# Patient Record
Sex: Male | Born: 1990 | Race: White | Hispanic: No | Marital: Single | Smoking: Never smoker
Health system: Southern US, Community
[De-identification: ages and names within clinical notes are randomized; demographics above are authoritative.]

## PROBLEM LIST (undated history)

## (undated) DIAGNOSIS — F419 Anxiety disorder, unspecified: Secondary | ICD-10-CM

## (undated) DIAGNOSIS — F32A Depression, unspecified: Secondary | ICD-10-CM

## (undated) DIAGNOSIS — F319 Bipolar disorder, unspecified: Secondary | ICD-10-CM

## (undated) DIAGNOSIS — F909 Attention-deficit hyperactivity disorder, unspecified type: Secondary | ICD-10-CM

## (undated) HISTORY — PX: ANKLE SURGERY: SHX546

## (undated) HISTORY — DX: Depression, unspecified: F32.A

## (undated) HISTORY — DX: Anxiety disorder, unspecified: F41.9

---

## 2017-09-30 ENCOUNTER — Emergency Department (HOSPITAL_COMMUNITY)
Admission: EM | Admit: 2017-09-30 | Discharge: 2017-10-01 | Disposition: A | Discharge status: Home / Self Care | Payer: Self-pay | Attending: Emergency Medicine | Admitting: Emergency Medicine

## 2017-09-30 ENCOUNTER — Encounter (HOSPITAL_COMMUNITY): Payer: Self-pay

## 2017-09-30 DIAGNOSIS — F3131 Bipolar disorder, current episode depressed, mild: Secondary | ICD-10-CM | POA: Insufficient documentation

## 2017-09-30 DIAGNOSIS — F902 Attention-deficit hyperactivity disorder, combined type: Secondary | ICD-10-CM | POA: Insufficient documentation

## 2017-09-30 DIAGNOSIS — F319 Bipolar disorder, unspecified: Secondary | ICD-10-CM

## 2017-09-30 DIAGNOSIS — R45851 Suicidal ideations: Secondary | ICD-10-CM | POA: Insufficient documentation

## 2017-09-30 HISTORY — DX: Bipolar disorder, unspecified: F31.9

## 2017-09-30 HISTORY — DX: Attention-deficit hyperactivity disorder, unspecified type: F90.9

## 2017-09-30 LAB — COMPREHENSIVE METABOLIC PANEL
ALBUMIN: 3.8 g/dL (ref 3.5–5.0)
ALK PHOS: 50 U/L (ref 38–126)
ALT: 19 U/L (ref 17–63)
ANION GAP: 11 (ref 5–15)
AST: 33 U/L (ref 15–41)
BUN: 15 mg/dL (ref 6–20)
CALCIUM: 9.4 mg/dL (ref 8.9–10.3)
CHLORIDE: 105 mmol/L (ref 101–111)
CO2: 23 mmol/L (ref 22–32)
Creatinine, Ser: 0.95 mg/dL (ref 0.61–1.24)
GFR calc Af Amer: >60 mL/min (ref >60)
GFR calc non Af Amer: >60 mL/min (ref >60)
GLUCOSE: 115 mg/dL — AB (ref 65–99)
Potassium: 3.9 mmol/L (ref 3.5–5.1)
Sodium: 139 mmol/L (ref 135–145)
Total Bilirubin: 0.5 mg/dL (ref 0.3–1.2)
Total Protein: 6.4 g/dL — ABNORMAL LOW (ref 6.5–8.1)

## 2017-09-30 LAB — RAPID URINE DRUG SCREEN, HOSP PERFORMED
AMPHETAMINES: NOT DETECTED
BARBITURATES: NOT DETECTED
BENZODIAZEPINES: NOT DETECTED
COCAINE: NOT DETECTED
OPIATES: NOT DETECTED
TETRAHYDROCANNABINOL: NOT DETECTED

## 2017-09-30 LAB — ETHANOL: Alcohol, Ethyl (B): <10 mg/dL (ref <10)

## 2017-09-30 LAB — SALICYLATE LEVEL

## 2017-09-30 LAB — CBC
HEMATOCRIT: 43.4 % (ref 39.0–52.0)
HEMOGLOBIN: 15 g/dL (ref 13.0–17.0)
MCH: 31.4 pg (ref 26.0–34.0)
MCHC: 34.6 g/dL (ref 30.0–36.0)
MCV: 90.8 fL (ref 78.0–100.0)
Platelets: 264 10*3/uL (ref 150–400)
RBC: 4.78 MIL/uL (ref 4.22–5.81)
RDW: 12.5 % (ref 11.5–15.5)
WBC: 9.1 10*3/uL (ref 4.0–10.5)

## 2017-09-30 LAB — ACETAMINOPHEN LEVEL

## 2017-09-30 MED ORDER — NICOTINE 21 MG/24HR TD PT24
21.0000 mg | MEDICATED_PATCH | Freq: Every day | TRANSDERMAL | Status: DC
  Administered 2017-09-30 – 2017-10-01 (×2): 21 mg via TRANSDERMAL
  Filled 2017-09-30 (×2): qty 1

## 2017-09-30 MED ORDER — ZOLPIDEM TARTRATE 5 MG PO TABS
5.0000 mg | ORAL_TABLET | Freq: Every evening | ORAL | Status: DC | PRN
  Administered 2017-09-30: 5 mg via ORAL
  Filled 2017-09-30: qty 1

## 2017-09-30 MED ORDER — LORAZEPAM 1 MG PO TABS
1.0000 mg | ORAL_TABLET | Freq: Four times a day (QID) | ORAL | Status: DC | PRN
  Administered 2017-09-30: 1 mg via ORAL
  Filled 2017-09-30: qty 1

## 2017-09-30 MED ORDER — ACETAMINOPHEN 325 MG PO TABS: 650.0000 mg | ORAL_TABLET | ORAL | Status: DC | PRN

## 2017-09-30 MED ORDER — ONDANSETRON HCL 4 MG PO TABS: 4.0000 mg | ORAL_TABLET | Freq: Three times a day (TID) | ORAL | Status: DC | PRN

## 2017-09-30 MED ORDER — ALUM & MAG HYDROXIDE-SIMETH 200-200-20 MG/5ML PO SUSP: 30.0000 mL | Freq: Four times a day (QID) | ORAL | Status: DC | PRN

## 2017-09-30 NOTE — ED Notes (Signed)
TTS performed 

## 2017-09-30 NOTE — ED Notes (Signed)
Pt requesting something for sleep

## 2017-09-30 NOTE — ED Notes (Signed)
Pt changed into burgundy scrubs and socks. Pt removed all of his personal clothing. Pt wanded by security. Belongings placed at nurses' desk for parents to take when they leave.

## 2017-09-30 NOTE — BH Assessment (Addendum)
Tele Assessment Note   Patient Name: Travis Glass MRN: [id number] Referring Physician: PA Cheron Schaumann Location of Patient: [facility] ED Location of Provider: Behavioral Health TTS Department  Travis Glass is an 27 y.o. male. The pt came in after getting in an argument with his parents and stating he wants to die.  The pt now says he was "just talking" and he does not want to die. He denies having a plan of how he would kill himself.  The pt reported there are guns in the home, but he does not have access to the guns. He currently lives with his parents.  TTS spoke with the pt while the pt's parents were in the room.  His parents agreed that the pt was probably taking out of anger and the pt does not have access to the guns in the home. The pt has one previous suicide attempt, when he was 16 and overdosed on medication. He was hospitalized at that time.  He currently denies any depressive symptoms.  He reports he is sleeping and eating OK.  He has a history of abusing heroin and xanax.  He has not used any substance in 9 months.  His UDS is negative for all substances.  The pt stated he wants to get back on his medication (Seroquel) and has been off of it for about a year.  He stated he has not found a provider, because he does not have any insurance.  He denies HI, SA and psychosis   Diagnosis: F31.31 Bipolar I disorder, Current or most recent episode depressed, Mild, by history  Past Medical History:  Past Medical History:  Diagnosis Date  . ADHD   . Bipolar 1 disorder (HCC)     History reviewed. No pertinent surgical history.  Family History: No family history on file.  Social History:  reports that  has never smoked. he has never used smokeless tobacco. He reports that he does not drink alcohol or use drugs.  Additional Social History:  Alcohol / Drug Use Pain Medications: See MAR Prescriptions: See MAR Over the Counter: See MAR History of alcohol / drug use?:  Yes Longest period of sobriety (when/how long): 9 months Negative Consequences of Use: Legal Substance #1 Name of Substance 1: heroin 1 - Age of First Use: 18 1 - Amount (size/oz):  half to a gram  1 - Frequency: daily 1 - Duration: 8 years 1 - Last Use / Amount: 01/2017 Substance #2 Name of Substance 2: Xanax 2 - Age of First Use: 18 2 - Amount (size/oz): 2 miligrams 2 - Frequency: 2 times a week 2 - Duration: 8 years 2 - Last Use / Amount: 01/2017  CIWA: CIWA-Ar BP: 136/78 Pulse Rate: 72 COWS:    Allergies: No Known Allergies  Home Medications:  (Not in a hospital admission)  OB/GYN Status:  No LMP for male patient.  General Assessment Data Location of Assessment: [facility] ED TTS Assessment: In system Is this a Tele or Face-to-Face Assessment?: Tele Assessment Is this an Initial Assessment or a Re-assessment for this encounter?: Initial Assessment Marital status: Single Maiden name: NA Is patient pregnant?: Other (Comment)(a male) Pregnancy Status: No Living Arrangements: Parent Can pt return to current living arrangement?: Yes Admission Status: Voluntary Is patient capable of signing voluntary admission?: Yes Referral Source: Self/Family/Friend Insurance type: none     Crisis Care Plan Living Arrangements: Parent Legal Guardian: Other:(Self) Name of Psychiatrist: none Name of Therapist: none  Education Status Is patient  currently in school?: No Current Grade: NA Highest grade of school patient has completed: high school diploma Name of school: NA Contact person: NA  Risk to self with the past 6 months Suicidal Ideation: No Has patient been a risk to self within the past 6 months prior to admission? : No Suicidal Intent: No Has patient had any suicidal intent within the past 6 months prior to admission? : No Is patient at risk for suicide?: No Suicidal Plan?: No Has patient had any suicidal plan within the past 6 months prior to admission? : No Access to  Means: No What has been your use of drugs/alcohol within the last 12 months?: past heroin and xanax use Previous Attempts/Gestures: Yes How many times?: 1 Other Self Harm Risks: none Triggers for Past Attempts: Unknown Intentional Self Injurious Behavior: None Family Suicide History: Yes(grand father committed suicide) Recent stressful life event(s): Conflict (Comment)(argument with parents) Persecutory voices/beliefs?: No Depression: No Substance abuse history and/or treatment for substance abuse?: Yes Suicide prevention information given to non-admitted patients: Yes  Risk to Others within the past 6 months Homicidal Ideation: No Does patient have any lifetime risk of violence toward others beyond the six months prior to admission? : No Thoughts of Harm to Others: No Current Homicidal Intent: No Current Homicidal Plan: No Access to Homicidal Means: No Identified Victim: NA History of harm to others?: No Assessment of Violence: None Noted Violent Behavior Description: none Does patient have access to weapons?: No(guns in home but locked up) Criminal Charges Pending?: No Does patient have a court date: No Is patient on probation?: No  Psychosis Hallucinations: None noted Delusions: None noted  Mental Status Report Appearance/Hygiene: Unremarkable Eye Contact: Good Motor Activity: Freedom of movement, Unremarkable Speech: Logical/coherent Level of Consciousness: Alert Mood: Pleasant Affect: Appropriate to circumstance Anxiety Level: None Thought Processes: Coherent, Relevant Judgement: Partial Orientation: Person, Place, Time, Situation, Appropriate for developmental age Obsessive Compulsive Thoughts/Behaviors: None  Cognitive Functioning Concentration: Normal Memory: Recent Intact, Remote Intact IQ: Average Insight: Fair Impulse Control: Fair Appetite: Good Weight Loss: 0 Weight Gain: 0 Sleep: No Change Total Hours of Sleep: 8 Vegetative Symptoms:  None  ADLScreening [facility] Assessment Services) Patient's cognitive ability adequate to safely complete daily activities?: Yes Patient able to express need for assistance with ADLs?: Yes Independently performs ADLs?: Yes (appropriate for developmental age)  Prior Inpatient Therapy Prior Inpatient Therapy: Yes Prior Therapy Dates: 2009 Prior Therapy Facilty/Provider(s): [facility] in [place] Reason for Treatment: NA  Prior Outpatient Therapy Prior Outpatient Therapy: Yes Prior Therapy Dates: 2018 Prior Therapy Facilty/Provider(s): facilty in [place] Reason for Treatment: bipolar Does patient have an ACCT team?: No Does patient have Intensive In-House Services?  : No Does patient have [facility] services? : No Does patient have P4CC services?: No  ADL Screening (condition at time of admission) Patient's cognitive ability adequate to safely complete daily activities?: Yes Patient able to express need for assistance with ADLs?: Yes Independently performs ADLs?: Yes (appropriate for developmental age)       Abuse/Neglect Assessment (Assessment to be complete while patient is alone) Abuse/Neglect Assessment Can Be Completed: Yes Physical Abuse: Denies Verbal Abuse: Denies Sexual Abuse: Denies Exploitation of patient/patient's resources: Denies Self-Neglect: Denies Values / Beliefs Cultural Requests During Hospitalization: None Spiritual Requests During Hospitalization: None Consults Spiritual Care Consult Needed: No Social Work Consult Needed: No Merchant navy officerAdvance Directives (For Healthcare) Does Patient Have a Medical Advance Directive?: No Would patient like information on creating a medical advance directive?: No - Patient  declined    Additional Information 1:1 In Past 12 Months?: No CIRT Risk: No Elopement Risk: No Does patient have medical clearance?: Yes     Disposition:  Disposition Initial Assessment Completed for this Encounter: Yes Disposition of Patient:  Pending Review with psychiatrist   Leighton Ruff, NP recommends the pt be observed overnight and be reassessed in the AM. RN  This service was provided via telemedicine using a 2-way, interactive audio and video technology.  Names of all persons participating in this telemedicine service and their role in this encounter. Name: Riley Churches Role: TTS  Name: Lum Stillinger Role: Pt  Name:  Role:   Name:  Role:     Ottis Stain 09/30/2017 5:32 PM

## 2017-09-30 NOTE — ED Triage Notes (Signed)
Patient here requesting help to get back on his bipolar meds. States has not taken in 1 year and reports outburst today that directed him here. Denies suicidal and homicidal thoughts. Alert and oriented. Denies etoh and drug use

## 2017-09-30 NOTE — ED Notes (Signed)
TTS being performed.  

## 2017-09-30 NOTE — ED Provider Notes (Signed)
[facility] EMERGENCY DEPARTMENT Provider Note   CSN: [id number] Arrival date & time: 09/30/17  1337     History   Chief Complaint No chief complaint on file.   HPI Travis Glass is a 27 y.o. male.  The history is provided by the patient. No language interpreter was used.  Mental Health Problem  Presenting symptoms: suicidal threats   Patient accompanied by:  Parent Degree of incapacity (severity):  Severe Onset quality:  Gradual Timing:  Constant Progression:  Worsening Chronicity:  New Context: not alcohol use and not drug abuse   Treatment compliance:  Untreated Relieved by:  Nothing Worsened by:  Nothing Associated symptoms: anxiety   Risk factors: no family hx of mental illness   Pt moved here with parents several months ago.  He does not have a provider.  He has not signed up for [facility] medicaid.  He has a history of bipolar disorder.  Pt has not had any medication for the past year.  Pt reports he thought he would be okay without them.  Pt's parents report pt told them he would kill himself today.  Pt reports he does not feel suicidal now.  Pt's parents report they told him that he had to leave or come here.  Parents report they were ready to call the sheriff today due to pts behavior.  Parents report pt needs help  Past Medical History:  Diagnosis Date  . ADHD   . Bipolar 1 disorder (HCC)     There are no active problems to display for this patient.   History reviewed. No pertinent surgical history.     Home Medications    Prior to Admission medications   Not on File    Family History No family history on file.  Social History Social History   Tobacco Use  . Smoking status: Never Smoker  . Smokeless tobacco: Never Used  Substance Use Topics  . Alcohol use: No    Frequency: Never  . Drug use: No     Allergies   Patient has no known allergies.   Review of Systems Review of Systems  Psychiatric/Behavioral: The  patient is nervous/anxious.   All other systems reviewed and are negative.    Physical Exam Updated Vital Signs BP 136/78   Pulse 72   Temp 98.2 F (36.8 C) (Oral)   Resp 18   SpO2 100%   Physical Exam  Constitutional: He appears well-developed and well-nourished.  HENT:  Head: Normocephalic and atraumatic.  Right Ear: External ear normal.  Left Ear: External ear normal.  Nose: Nose normal.  Mouth/Throat: Oropharynx is clear and moist.  Eyes: Conjunctivae are normal.  Neck: Neck supple.  Cardiovascular: Normal rate, regular rhythm and normal heart sounds.  No murmur heard. Pulmonary/Chest: Effort normal and breath sounds normal. No respiratory distress.  Abdominal: Soft. There is no tenderness.  Musculoskeletal: He exhibits no edema.  Neurological: He is alert.  Skin: Skin is warm and dry.  Psychiatric: He has a normal mood and affect.  Nursing note and vitals reviewed.    ED Treatments / Results  Labs (all labs ordered are listed, but only abnormal results are displayed) Labs Reviewed  COMPREHENSIVE METABOLIC PANEL - Abnormal; Notable for the following components:      Result Value   Glucose, Bld 115 (*)    Total Protein 6.4 (*)    All other components within normal limits  ACETAMINOPHEN LEVEL - Abnormal; Notable for the following components:  Acetaminophen (Tylenol), Serum <10 (*)    All other components within normal limits  ETHANOL  SALICYLATE LEVEL  CBC  RAPID URINE DRUG SCREEN, HOSP PERFORMED    EKG  EKG Interpretation None       Radiology No results found.  Procedures Procedures (including critical care time)  Medications Ordered in ED Medications - No data to display   Initial Impression / Assessment and Plan / ED Course  I have reviewed the triage vital signs and the nursing notes.  Pertinent labs & imaging results that were available during my care of the patient were reviewed by me and considered in my medical decision making (see  chart for details).    TTS to consult.     Final Clinical Impressions(s) / ED Diagnoses   Final diagnoses:  Suicidal ideation  Bipolar 1 disorder [facility](HCC)    ED Discharge Orders    None       Osie CheeksSofia, Cable Fearn K, PA-C 09/30/17 1659    Benjiman CorePickering, Nathan, MD 09/30/17 2236

## 2017-09-30 NOTE — ED Provider Notes (Signed)
Patient placed in Quick Look pathway, seen and evaluated   Chief Complaint: depression, bipolar  HPI:   Pt is off his Psych meds.  Pt threatened suicide today.  Family reports they have not been able to establish care for pt here   ROS: Review of Systems  Constitutional: Negative for fever.  Respiratory: Negative for cough.   Cardiovascular: Negative for chest pain.  Neurological: Negative for dizziness and speech change.  Psychiatric/Behavioral: Positive for depression and suicidal ideas.    Physical Exam:   Gen: No distress  Neuro: Awake and Alert  Skin: Warm    Focused Exam: Lungs normal rate Heart rrr  Labs ordered  Initiation of care has begun. The patient has been counseled on the process, plan, and necessity for staying for the completion/evaluation, and the remainder of the medical screening examination   Travis CheeksSofia, Travis Welliver Glass, Travis Glass 09/30/17 1436    Benjiman CorePickering, Nathan, MD 09/30/17 2236

## 2017-09-30 NOTE — ED Notes (Signed)
Pt and parents [facility] - pt to remain in ED overnight and be re-assessed in am. Voiced agreement w/tx plan. Pt and Parents voiced understanding of Medical Clearance Pt Policy - pt signed form  - copy given to pt. Pt noted to be anxious. Pt gave all of his belongings including his cell phone to his parents. NO BELONGINGS IN ED. Dinner tray ordered for pt.

## 2017-10-01 DIAGNOSIS — Z915 Personal history of self-harm: Secondary | ICD-10-CM

## 2017-10-01 DIAGNOSIS — F319 Bipolar disorder, unspecified: Secondary | ICD-10-CM

## 2017-10-01 NOTE — ED Notes (Signed)
Declined W/C at D/C and was escorted to lobby by RN. 

## 2017-10-01 NOTE — Progress Notes (Signed)
Patient reassessed by Psychiatry.  Patient does not meet inpatient criteria per Fransisca KaufmannLaura Davis, NP and Dr. Lucianne MussKumar.  Patient is recommended for [facility]. [facility] Nurse, notified.  Timmothy EulerJean T. Kaylyn LimSutter, MSW, LCSWA Disposition Clinical Social Work [phone] (cell) [phone] (office)

## 2017-10-01 NOTE — ED Triage Notes (Signed)
TTS done 

## 2017-10-01 NOTE — ED Triage Notes (Signed)
[facility]'s clothing was sent home with family. Unable to put Pt. In front lobby in wine scrubs and no shoes to wait for family . Family can not come for 1.5 hours. Pt will need to wait in room.

## 2017-10-01 NOTE — Consult Note (Signed)
Telepsych Consultation   Reason for Consult: Mood lability  Referring Physician: EDP Location of Patient: [facility]  Location of Provider: [facility]  Patient Identification: Travis Glass MRN:  [id number] Principal Diagnosis: Bipolar 1 disorder, depressed ([place]) Diagnosis:   Patient Active Problem List   Diagnosis Date Noted  . Bipolar 1 disorder, depressed ([place]) [F31.9] 10/01/2017    Total Time spent with patient: 20 minutes  Subjective:   Travis Glass is a 27 y.o. male patient admitted with mood instability since being off Seroquel for the last several month  HPI:    Per initial TTS Assessment by Virgina Organ:  Travis Glass is an 27 y.o. male. The pt came in after getting in an argument with his parents and stating he wants to die.  The pt now says he was "just talking" and he does not want to die. He denies having a plan of how he would kill himself.  The pt reported there are guns in the home, but he does not have access to the guns. He currently lives with his parents.  TTS spoke with the pt while the pt's parents were in the room.  His parents agreed that the pt was probably taking out of anger and the pt does not have access to the guns in the home. The pt has one previous suicide attempt, when he was 9 and overdosed on medication. He was hospitalized at that time.  He currently denies any depressive symptoms.  He reports he is sleeping and eating OK.  He has a history of abusing heroin and xanax.  He has not used any substance in 9 months.  His UDS is negative for all substances.  The pt stated he wants to get back on his medication (Seroquel) and has been off of it for about a year.  He stated he has not found a provider, because he does not have any insurance.   Per am psych eval: Travis Glass maintains that he was never suicidal or desiring to harm himself. He states "I lost my insurance when I turned 26. I moved here  from [place] and did not know where to get a new Provider. I have heard some talk about [facility]. I am ready to go home. I told them all this yesterday but they still insisted to keep me overnight." Patient continues to deny SI/HI/AVH. There is not evidence that the patient is responding to any internal stimuli. He appears to be future oriented and motivated to seek outpatient treatment for medication management. Case discussed with Dr. Dwyane Dee who agrees with recommendation to discharge home with outpatient follow up in place.   Past Psychiatric History: Bipolar 1  Risk to Self: Suicidal Ideation: No Suicidal Intent: No Is patient at risk for suicide?: No Suicidal Plan?: No Access to Means: No What has been your use of drugs/alcohol within the last 12 months?: past heroin and xanax use How many times?: 1 Other Self Harm Risks: none Triggers for Past Attempts: Unknown Intentional Self Injurious Behavior: None Risk to Others: Homicidal Ideation: No Thoughts of Harm to Others: No Current Homicidal Intent: No Current Homicidal Plan: No Access to Homicidal Means: No Identified Victim: NA History of harm to others?: No Assessment of Violence: None Noted Violent Behavior Description: none Does patient have access to weapons?: No(guns in home but locked up) Criminal Charges Pending?: No Does patient have a court date: No Prior Inpatient Therapy: Prior Inpatient Therapy: Yes Prior Therapy Dates:  2009 Prior Therapy Facilty/Provider(s): [facility] in [place] Reason for Treatment: NA Prior Outpatient Therapy: Prior Outpatient Therapy: Yes Prior Therapy Dates: 2018 Prior Therapy Facilty/Provider(s): [facility] in [place] Reason for Treatment: bipolar Does patient have an ACCT team?: No Does patient have Intensive In-House Services?  : No Does patient have [facility] services? : No Does patient have [facility] services?: No  Past Medical History:  Past Medical History:  Diagnosis Date  .  ADHD   . Bipolar 1 disorder ([place])    History reviewed. No pertinent surgical history. Family History: No family history on file. Family Psychiatric  History: None Social History:  Social History   Substance and Sexual Activity  Alcohol Use No  . Frequency: Never     Social History   Substance and Sexual Activity  Drug Use No    Social History   Socioeconomic History  . Marital status: Single    Spouse name: None  . Number of children: None  . Years of education: None  . Highest education level: None  Social Needs  . Financial resource strain: None  . Food insecurity - worry: None  . Food insecurity - inability: None  . Transportation needs - medical: None  . Transportation needs - non-medical: None  Occupational History  . None  Tobacco Use  . Smoking status: Never Smoker  . Smokeless tobacco: Never Used  Substance and Sexual Activity  . Alcohol use: No    Frequency: Never  . Drug use: No  . Sexual activity: None  Other Topics Concern  . None  Social History Narrative  . None   Additional Social History:    Allergies:  No Known Allergies  Labs:  Results for orders placed or performed during the hospital encounter of 09/30/17 (from the past 48 hour(s))  Rapid urine drug screen (hospital performed)     Status: None   Collection Time: 09/30/17  2:07 PM  Result Value Ref Range   Opiates NONE DETECTED NONE DETECTED   Cocaine NONE DETECTED NONE DETECTED   Benzodiazepines NONE DETECTED NONE DETECTED   Amphetamines NONE DETECTED NONE DETECTED   Tetrahydrocannabinol NONE DETECTED NONE DETECTED   Barbiturates NONE DETECTED NONE DETECTED    Comment: (NOTE) DRUG SCREEN FOR MEDICAL PURPOSES ONLY.  IF CONFIRMATION IS NEEDED FOR ANY PURPOSE, NOTIFY LAB WITHIN 5 DAYS. LOWEST DETECTABLE LIMITS FOR URINE DRUG SCREEN Drug Class                     Cutoff (ng/mL) Amphetamine and metabolites    1000 Barbiturate and metabolites    200 Benzodiazepine                  324 Tricyclics and metabolites     300 Opiates and metabolites        300 Cocaine and metabolites        300 THC                            50 Performed at [facility], [place] [address], [place], [place] [address]   Comprehensive metabolic panel     Status: Abnormal   Collection Time: 09/30/17  2:39 PM  Result Value Ref Range   Sodium 139 135 - 145 mmol/L   Potassium 3.9 3.5 - 5.1 mmol/L   Chloride 105 101 - 111 mmol/L   CO2 23 22 - 32 mmol/L   Glucose, Bld 115 (H) 65 -  99 mg/dL   BUN 15 6 - 20 mg/dL   Creatinine, Ser 0.95 0.61 - 1.24 mg/dL   Calcium 9.4 8.9 - 10.3 mg/dL   Total Protein 6.4 (L) 6.5 - 8.1 g/dL   Albumin 3.8 3.5 - 5.0 g/dL   AST 33 15 - 41 U/L   ALT 19 17 - 63 U/L   Alkaline Phosphatase 50 38 - 126 U/L   Total Bilirubin 0.5 0.3 - 1.2 mg/dL   GFR calc non Af Amer >60 >60 mL/min   GFR calc Af Amer >60 >60 mL/min    Comment: (NOTE) The eGFR has been calculated using the CKD EPI equation. This calculation has not been validated in all clinical situations. eGFR's persistently <60 mL/min signify possible Chronic Kidney Disease.    Anion gap 11 5 - 15    Comment: Performed at [facility] [address] [place], [place] [address]  Ethanol     Status: None   Collection Time: 09/30/17  2:39 PM  Result Value Ref Range   Alcohol, Ethyl (B) <10 <10 mg/dL    Comment:        LOWEST DETECTABLE LIMIT FOR SERUM ALCOHOL IS 10 mg/dL FOR MEDICAL PURPOSES ONLY Performed at [facility], [place] [address], [place], [place] [address]   Salicylate level     Status: None   Collection Time: 09/30/17  2:39 PM  Result Value Ref Range   Salicylate Lvl <0.0 2.8 - 30.0 mg/dL    Comment: Performed at [facility] [address], [place], [place] [address]  Acetaminophen level     Status: Abnormal   Collection Time: 09/30/17  2:39 PM  Result Value Ref Range   Acetaminophen (Tylenol), Serum <10 (L) 10 - 30 ug/mL    Comment:        THERAPEUTIC CONCENTRATIONS  VARY SIGNIFICANTLY. A RANGE OF 10-30 ug/mL MAY BE AN EFFECTIVE CONCENTRATION FOR MANY PATIENTS. HOWEVER, SOME ARE BEST TREATED AT CONCENTRATIONS OUTSIDE THIS RANGE. ACETAMINOPHEN CONCENTRATIONS >150 ug/mL AT 4 HOURS AFTER INGESTION AND >50 ug/mL AT 12 HOURS AFTER INGESTION ARE OFTEN ASSOCIATED WITH TOXIC REACTIONS. Performed at [facility], [place] [address] [place], [place] [address]   cbc     Status: None   Collection Time: 09/30/17  2:39 PM  Result Value Ref Range   WBC 9.1 4.0 - 10.5 K/uL   RBC 4.78 4.22 - 5.81 MIL/uL   Hemoglobin 15.0 13.0 - 17.0 g/dL   HCT 43.4 39.0 - 52.0 %   MCV 90.8 78.0 - 100.0 fL   MCH 31.4 26.0 - 34.0 pg   MCHC 34.6 30.0 - 36.0 g/dL   RDW 12.5 11.5 - 15.5 %   Platelets 264 150 - 400 K/uL    Comment: Performed at [facility] [address] [place], [place] [address]    Medications:  Current Facility-Administered Medications  Medication Dose Route Frequency Provider Last Rate Last Dose  . acetaminophen (TYLENOL) tablet 650 mg  650 mg Oral Q4H PRN Caryl Ada K, PA-C      . alum & mag hydroxide-simeth (MAALOX/MYLANTA) 200-200-20 MG/5ML suspension 30 mL  30 mL Oral Q6H PRN Fransico Meadow, [place]      . LORazepam (ATIVAN) tablet 1 mg  1 mg Oral Q6H PRN Fransico Meadow, PA-C   1 mg at 09/30/17 1821  . nicotine (NICODERM CQ - dosed in mg/24 hours) patch 21 mg  21 mg Transdermal Daily Wyatt,  Hollace Kinnier, PA-C   21 mg at 09/30/17 1821  . ondansetron (ZOFRAN) tablet 4 mg  4 mg Oral Q8H PRN Sofia, Leslie K, PA-C      . zolpidem (AMBIEN) tablet 5 mg  5 mg Oral QHS PRN Sofia, Leslie K, PA-C   5 mg at 09/30/17 2255   Current Outpatient Medications  Medication Sig Dispense Refill  . acetaminophen (TYLENOL) 500 MG tablet Take 500 mg by mouth every 6 (six) hours as needed for headache (pain).    . naproxen sodium (ALEVE) 220 MG tablet Take 440 mg by mouth 2 (two) times daily as needed (pain/headache).      Musculoskeletal:  Unable to assess via  camera   Psychiatric Specialty Exam: Physical Exam  Review of Systems  Psychiatric/Behavioral: Negative for depression, hallucinations, memory loss, substance abuse and suicidal ideas. The patient is not nervous/anxious and does not have insomnia.     Blood pressure 120/70, pulse 67, temperature 98.6 F (37 C), temperature source Oral, resp. rate 20, SpO2 98 %.There is no height or weight on file to calculate BMI.  General Appearance: Casual  Eye Contact:  Good  Speech:  Clear and Coherent  Volume:  Normal  Mood:  Euthymic  Affect:  Appropriate  Thought Process:  Coherent and Goal Directed  Orientation:  Full (Time, Place, and Person)  Thought Content:  Desire to re-start seroquel   Suicidal Thoughts:  No  Homicidal Thoughts:  No  Memory:  Immediate;   Good Recent;   Good Remote;   Good  Judgement:  Fair  Insight:  Present  Psychomotor Activity:  Normal  Concentration:  Concentration: Good and Attention Span: Good  Recall:  Good  Fund of Knowledge:  Good  Language:  Good  Akathisia:  No  Handed:  Right  AIMS (if indicated):     Assets:  Communication Skills Desire for Improvement Housing Intimacy Leisure Time Physical Health Resilience Social Support Talents/Skills  ADL's:  Intact  Cognition:  WNL  Sleep:        Treatment Plan Summary: Plan Discharge home with outpatient resources for [place]  Disposition: No evidence of imminent risk to self or others at present.   Patient does not meet criteria for psychiatric inpatient admission. Supportive therapy provided about ongoing stressors. Discussed crisis plan, support from social network, calling 911, coming to the Emergency Department, and calling Suicide Hotline.  This service was provided via telemedicine using a 2-way, interactive audio and video technology.  Names of all persons participating in this telemedicine service and their role in this encounter. Name: Brysun Finder  Role: Patient   Name:  Elmarie Shiley  Role: Provider   Name:  Role:   Name:  Role:     Elmarie Shiley, NP 10/01/2017 9:39 AM

## 2017-10-01 NOTE — Discharge Instructions (Signed)
Follow up with the resources give to you as suggested by behavioral health services
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
## 2017-10-01 NOTE — ED Provider Notes (Signed)
Cleared by psychiatry for discharge and outpatient follow up.   Linwood DibblesKnapp, Drishti Pepperman, MD 10/01/17 [phone]

## 2019-10-28 DIAGNOSIS — G8929 Other chronic pain: Secondary | ICD-10-CM | POA: Insufficient documentation

## 2019-10-28 DIAGNOSIS — M25872 Other specified joint disorders, left ankle and foot: Secondary | ICD-10-CM | POA: Insufficient documentation

## 2020-12-21 ENCOUNTER — Emergency Department (HOSPITAL_BASED_OUTPATIENT_CLINIC_OR_DEPARTMENT_OTHER): Payer: BC Managed Care – PPO

## 2020-12-21 ENCOUNTER — Other Ambulatory Visit: Payer: Self-pay

## 2020-12-21 ENCOUNTER — Emergency Department (HOSPITAL_BASED_OUTPATIENT_CLINIC_OR_DEPARTMENT_OTHER)
Admission: EM | Admit: 2020-12-21 | Discharge: 2020-12-21 | Disposition: A | Discharge status: Home / Self Care | Payer: BC Managed Care – PPO | Attending: Emergency Medicine | Admitting: Emergency Medicine

## 2020-12-21 ENCOUNTER — Encounter (HOSPITAL_BASED_OUTPATIENT_CLINIC_OR_DEPARTMENT_OTHER): Payer: Self-pay

## 2020-12-21 ENCOUNTER — Emergency Department (HOSPITAL_BASED_OUTPATIENT_CLINIC_OR_DEPARTMENT_OTHER): Payer: BC Managed Care – PPO | Admitting: Radiology

## 2020-12-21 DIAGNOSIS — Y92410 Unspecified street and highway as the place of occurrence of the external cause: Secondary | ICD-10-CM | POA: Insufficient documentation

## 2020-12-21 DIAGNOSIS — S2020XA Contusion of thorax, unspecified, initial encounter: Secondary | ICD-10-CM | POA: Diagnosis not present

## 2020-12-21 DIAGNOSIS — S20219A Contusion of unspecified front wall of thorax, initial encounter: Secondary | ICD-10-CM

## 2020-12-21 DIAGNOSIS — H538 Other visual disturbances: Secondary | ICD-10-CM | POA: Insufficient documentation

## 2020-12-21 DIAGNOSIS — S060X0A Concussion without loss of consciousness, initial encounter: Secondary | ICD-10-CM | POA: Diagnosis not present

## 2020-12-21 DIAGNOSIS — S0990XA Unspecified injury of head, initial encounter: Secondary | ICD-10-CM | POA: Diagnosis present

## 2020-12-21 NOTE — Discharge Instructions (Addendum)
Take over-the-counter medications as needed for pain.  Follow-up with a primary care doctor to be rechecked if symptoms have not resolved in the next week
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
## 2022-05-30 DIAGNOSIS — F19959 Other psychoactive substance use, unspecified with psychoactive substance-induced psychotic disorder, unspecified: Secondary | ICD-10-CM | POA: Insufficient documentation

## 2022-05-30 DIAGNOSIS — F1520 Other stimulant dependence, uncomplicated: Secondary | ICD-10-CM | POA: Insufficient documentation
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
## 2023-03-15 DIAGNOSIS — Z419 Encounter for procedure for purposes other than remedying health state, unspecified: Secondary | ICD-10-CM | POA: Diagnosis not present

## 2023-04-15 DIAGNOSIS — Z419 Encounter for procedure for purposes other than remedying health state, unspecified: Secondary | ICD-10-CM | POA: Diagnosis not present

## 2023-04-30 ENCOUNTER — Ambulatory Visit (INDEPENDENT_AMBULATORY_CARE_PROVIDER_SITE_OTHER): Payer: Medicaid Other | Admitting: Family Medicine

## 2023-04-30 ENCOUNTER — Encounter: Payer: Self-pay | Admitting: Family Medicine

## 2023-04-30 VITALS — BP 120/70 | HR 71 | Temp 98.3°F | Ht 77.0 in | Wt 198.0 lb

## 2023-04-30 DIAGNOSIS — F901 Attention-deficit hyperactivity disorder, predominantly hyperactive type: Secondary | ICD-10-CM | POA: Insufficient documentation

## 2023-04-30 DIAGNOSIS — Z1159 Encounter for screening for other viral diseases: Secondary | ICD-10-CM

## 2023-04-30 DIAGNOSIS — Z833 Family history of diabetes mellitus: Secondary | ICD-10-CM | POA: Diagnosis not present

## 2023-04-30 DIAGNOSIS — Z0001 Encounter for general adult medical examination with abnormal findings: Secondary | ICD-10-CM | POA: Diagnosis not present

## 2023-04-30 DIAGNOSIS — F1910 Other psychoactive substance abuse, uncomplicated: Secondary | ICD-10-CM | POA: Insufficient documentation

## 2023-04-30 DIAGNOSIS — Z114 Encounter for screening for human immunodeficiency virus [HIV]: Secondary | ICD-10-CM | POA: Diagnosis not present

## 2023-04-30 DIAGNOSIS — F319 Bipolar disorder, unspecified: Secondary | ICD-10-CM | POA: Diagnosis not present

## 2023-04-30 DIAGNOSIS — Z23 Encounter for immunization: Secondary | ICD-10-CM | POA: Diagnosis not present

## 2023-04-30 DIAGNOSIS — Z0000 Encounter for general adult medical examination without abnormal findings: Secondary | ICD-10-CM | POA: Insufficient documentation

## 2023-04-30 NOTE — Assessment & Plan Note (Signed)
Reports this is uncontrolled. Was diagnosed around age 32. Has tried Adderall, Ritalin, and Concerta in the past. Will refer to psychiatry for further evaluation and management due to his comorbid conditions.

## 2023-04-30 NOTE — Assessment & Plan Note (Signed)
Current occasional user of crack cocaine and heroin. Counseled on importance of abstaining from these substances and encouraged to seek help. Provided with center information for [facility].

## 2023-04-30 NOTE — Progress Notes (Signed)
New Patient Office Visit  Subjective    Patient ID: Travis Glass, male    DOB: 20-Sep-1990  Age: 32 y.o. MRN: [id number]  CC: No chief complaint on file.   HPI Travis Glass presents to establish care. Oriented to practice routines and expectations. No recent PCP. PMH include ADHD, anxiety, depression, Bipolar 1 disorder, and substance abuse including crack cocaine and heroin and reports he has not used those in 2 weeks. He is working with getting established with the [facility] for his substance use. He would like to get back on his medications, he has been unmedicated for his bipolar for 6 months, does recall taking Seroquel, Concerta, Ritalin, Adderall. He has had ADHD since age 23. He does not see psychiatry or behavioral health.   Tobacco: vapes STI: declines Vaccines:  unsure about tetanus    Outpatient Encounter Medications as of 04/30/2023  Medication Sig   acetaminophen (TYLENOL) 500 MG tablet Take 500 mg by mouth every 6 (six) hours as needed for headache (pain).   naproxen sodium (ALEVE) 220 MG tablet Take 440 mg by mouth 2 (two) times daily as needed (pain/headache).   No facility-administered encounter medications on file as of 04/30/2023.    Past Medical History:  Diagnosis Date   ADHD    Anxiety    Bipolar 1 disorder (HCC)    Depression     Past Surgical History:  Procedure Laterality Date   ANKLE SURGERY      History reviewed. No pertinent family history.  Social History   Socioeconomic History   Marital status: Single    Spouse name: Not on file   Number of children: Not on file   Years of education: Not on file   Highest education level: Not on file  Occupational History   Not on file  Tobacco Use   Smoking status: Never   Smokeless tobacco: Never  Vaping Use   Vaping status: Every Day  Substance and Sexual Activity   Alcohol use: No   Drug use: Yes    Types: Cocaine, Heroin    Comment: on occassion off/on per pt maybe 2  weeks   Sexual activity: Not on file  Other Topics Concern   Not on file  Social History Narrative   Not on file   Social Determinants of Health   Financial Resource Strain: Not on file  Food Insecurity: Not on file  Transportation Needs: Not on file  Physical Activity: Not on file  Stress: Not on file  Social Connections: Not on file  Intimate Partner Violence: Not on file    Review of Systems  Constitutional: Negative.   HENT: Negative.    Eyes: Negative.   Respiratory: Negative.    Cardiovascular: Negative.   Gastrointestinal: Negative.   Genitourinary: Negative.   Musculoskeletal:  Positive for joint pain (left ankle).  Skin: Negative.   Neurological: Negative.   Endo/Heme/Allergies: Negative.   Psychiatric/Behavioral:  Positive for substance abuse.   All other systems reviewed and are negative.       Objective    BP 120/70   Pulse 71   Temp 98.3 F (36.8 C) (Oral)   Ht 6\' 5"  (1.956 m)   Wt 198 lb (89.8 kg)   SpO2 96%   BMI 23.48 kg/m   Physical Exam Vitals and nursing note reviewed.  Constitutional:      Appearance: Normal appearance. He is normal weight.  HENT:     Head: Normocephalic and atraumatic.  Right Ear: Tympanic membrane, ear canal and external ear normal.     Left Ear: Tympanic membrane, ear canal and external ear normal.     Nose: Nose normal.     Mouth/Throat:     Mouth: Mucous membranes are moist.     Pharynx: Oropharynx is clear.  Eyes:     Extraocular Movements: Extraocular movements intact.     Right eye: Normal extraocular motion and no nystagmus.     Left eye: Normal extraocular motion and no nystagmus.     Conjunctiva/sclera: Conjunctivae normal.     Pupils: Pupils are equal, round, and reactive to light.  Cardiovascular:     Rate and Rhythm: Normal rate and regular rhythm.     Pulses: Normal pulses.     Heart sounds: Normal heart sounds.  Pulmonary:     Effort: Pulmonary effort is normal.     Breath sounds: Normal  breath sounds.  Abdominal:     General: Bowel sounds are normal.     Palpations: Abdomen is soft.  Genitourinary:    Comments: Deferred using shared decision making Musculoskeletal:        General: Normal range of motion.     Cervical back: Normal range of motion and neck supple.  Skin:    General: Skin is warm and dry.     Capillary Refill: Capillary refill takes less than 2 seconds.  Neurological:     General: No focal deficit present.     Mental Status: He is alert. Mental status is at baseline.  Psychiatric:        Mood and Affect: Mood normal.        Speech: Speech normal.        Behavior: Behavior normal.        Thought Content: Thought content normal.        Cognition and Memory: Cognition and memory normal.        Judgment: Judgment normal.         Assessment & Plan:   Problem List Items Addressed This Visit     Bipolar 1 disorder, depressed (HCC) - Primary    Denies SI/HI. Not currently seeing Psychiatry or medicated. Referral placed and I also provided him with list of local providers and encouraged him to check with insurance for list of providers. Also counseled on importance of abstaining from use of illegal substances and encouraged to seek rehab services at [facility] or another center.       Relevant Orders   Ambulatory referral to Psychiatry   Attention deficit hyperactivity disorder (ADHD), predominantly hyperactive type    Reports this is uncontrolled. Was diagnosed around age 84. Has tried Adderall, Ritalin, and Concerta in the past. Will refer to psychiatry for further evaluation and management due to his comorbid conditions.      Physical exam, annual    Today your medical history was reviewed and routine physical exam with labs was performed. Recommend 150 minutes of moderate intensity exercise weekly and consuming a well-balanced diet. Advised to stop smoking if a smoker, avoid smoking if a non-smoker, limit alcohol consumption to 1 drink per day for  women and 2 drinks per day for men, and avoid illicit drug use. Counseled on safe sex practices and offered STI testing today. Counseled on the importance of sunscreen use. Counseled in mental health awareness and when to seek medical care. Vaccine maintenance discussed. Appropriate health maintenance items reviewed. Return to office in 1 year for annual physical exam.  Relevant Orders   CBC with Differential/Platelet   COMPLETE METABOLIC PANEL WITH GFR   LDL Cholesterol, Direct   Substance abuse (HCC)    Current occasional user of crack cocaine and heroin. Counseled on importance of abstaining from these substances and encouraged to seek help. Provided with center information for [facility].      Other Visit Diagnoses     Family history of diabetes mellitus       Relevant Orders   Hemoglobin A1c   Screening for HIV (human immunodeficiency virus)       Relevant Orders   HIV Antibody (routine testing w rflx)   Need for hepatitis C screening test       Relevant Orders   Hepatitis C antibody   Need for vaccination       Relevant Orders   Flu vaccine trivalent PF, 6mos and older(Flulaval,Afluria,Fluarix,Fluzone) (Completed)   Tdap vaccine greater than or equal to 7yo IM (Completed)       Return in about 3 months (around 07/30/2023) for 1 year annual physical with labs 1 week prior, anxiety/depression.   Park Meo, FNP

## 2023-04-30 NOTE — Assessment & Plan Note (Signed)

## 2023-04-30 NOTE — Patient Instructions (Signed)
[facility]  It was great to meet you today and I'm excited to have you join the [facility] practice. I hope you had a positive experience today! If you feel so inclined, please feel free to recommend our practice to friends and family. Kurtis Bushman, FNP-C

## 2023-04-30 NOTE — Assessment & Plan Note (Signed)
Denies SI/HI. Not currently seeing Psychiatry or medicated. Referral placed and I also provided him with list of local providers and encouraged him to check with insurance for list of providers. Also counseled on importance of abstaining from use of illegal substances and encouraged to seek rehab services at [facility] or another center.

## 2023-05-01 LAB — CBC WITH DIFFERENTIAL/PLATELET
Absolute Monocytes: 525 cells/uL (ref 200–950)
Basophils Absolute: 77 {cells}/uL (ref 0–200)
Basophils Relative: 1.3 %
Eosinophils Absolute: 248 {cells}/uL (ref 15–500)
Eosinophils Relative: 4.2 %
HCT: 45.4 % (ref 38.5–50.0)
Hemoglobin: 16.2 g/dL (ref 13.2–17.1)
Lymphs Abs: 1800 {cells}/uL (ref 850–3900)
MCH: 31.1 pg (ref 27.0–33.0)
MCHC: 35.7 g/dL (ref 32.0–36.0)
MCV: 87.1 fL (ref 80.0–100.0)
MPV: 9.5 fL (ref 7.5–12.5)
Monocytes Relative: 8.9 %
Neutro Abs: 3251 {cells}/uL (ref 1500–7800)
Neutrophils Relative %: 55.1 %
Platelets: 380 10*3/uL (ref 140–400)
RBC: 5.21 10*6/uL (ref 4.20–5.80)
RDW: 12.6 % (ref 11.0–15.0)
Total Lymphocyte: 30.5 %
WBC: 5.9 10*3/uL (ref 3.8–10.8)

## 2023-05-01 LAB — COMPLETE METABOLIC PANEL WITH GFR
AG Ratio: 2.1 (calc) (ref 1.0–2.5)
ALT: 19 U/L (ref 9–46)
AST: 26 U/L (ref 10–40)
Albumin: 4.8 g/dL (ref 3.6–5.1)
Alkaline phosphatase (APISO): 48 U/L (ref 36–130)
BUN: 14 mg/dL (ref 7–25)
CO2: 29 mmol/L (ref 20–32)
Calcium: 9.7 mg/dL (ref 8.6–10.3)
Chloride: 104 mmol/L (ref 98–110)
Creat: 0.97 mg/dL (ref 0.60–1.26)
Globulin: 2.3 g/dL (ref 1.9–3.7)
Glucose, Bld: 97 mg/dL (ref 65–99)
Potassium: 4.8 mmol/L (ref 3.5–5.3)
Sodium: 138 mmol/L (ref 135–146)
Total Bilirubin: 0.7 mg/dL (ref 0.2–1.2)
Total Protein: 7.1 g/dL (ref 6.1–8.1)
eGFR: 106 mL/min/{1.73_m2} (ref >=60)

## 2023-05-01 LAB — LDL CHOLESTEROL, DIRECT: Direct LDL: 112 mg/dL — ABNORMAL HIGH (ref <100)

## 2023-05-01 LAB — HEMOGLOBIN A1C
Hgb A1c MFr Bld: 5.3 %{Hb} (ref <5.7)
Mean Plasma Glucose: 105 mg/dL
eAG (mmol/L): 5.8 mmol/L

## 2023-05-01 LAB — HEPATITIS C ANTIBODY: Hepatitis C Ab: NONREACTIVE

## 2023-05-01 LAB — HIV ANTIBODY (ROUTINE TESTING W REFLEX): HIV 1&2 Ab, 4th Generation: NONREACTIVE

## 2023-05-15 DIAGNOSIS — Z419 Encounter for procedure for purposes other than remedying health state, unspecified: Secondary | ICD-10-CM | POA: Diagnosis not present

## 2023-06-15 DIAGNOSIS — Z419 Encounter for procedure for purposes other than remedying health state, unspecified: Secondary | ICD-10-CM | POA: Diagnosis not present

## 2023-07-15 DIAGNOSIS — Z419 Encounter for procedure for purposes other than remedying health state, unspecified: Secondary | ICD-10-CM | POA: Diagnosis not present

## 2023-07-30 ENCOUNTER — Ambulatory Visit (INDEPENDENT_AMBULATORY_CARE_PROVIDER_SITE_OTHER): Payer: Medicaid Other | Admitting: Family Medicine

## 2023-07-30 ENCOUNTER — Encounter: Payer: Self-pay | Admitting: Family Medicine

## 2023-07-30 VITALS — BP 118/68 | HR 78 | Temp 97.5°F | Ht 77.0 in | Wt 211.0 lb

## 2023-07-30 DIAGNOSIS — F419 Anxiety disorder, unspecified: Secondary | ICD-10-CM | POA: Insufficient documentation

## 2023-07-30 DIAGNOSIS — F319 Bipolar disorder, unspecified: Secondary | ICD-10-CM | POA: Diagnosis not present

## 2023-07-30 NOTE — Assessment & Plan Note (Signed)
Well controlled on Lybalvi daily and Gabapentin TID. He is attending outpatient rehab 3 times weekly and seeing psychiatry there. Denies SI/HI. Does have family support, his sister just moved near him. Follow up in my office PRN and keep appts with psychiatry.

## 2023-07-30 NOTE — Progress Notes (Signed)
Subjective:  HPI: Travis Glass is a 32 y.o. male presenting on 07/30/2023 for Follow-up (3 months f/u)   HPI Patient is in today for follow-up for his anxiety and depression. He has established care at the [facility] for outpatient rehab 3 days weekly and is seeing psychiatry there. He has started Lybalvi 10-10mg  nightly and Gabapentin for his anxiety. He reports his symptoms are well controlled     07/30/2023   11:37 AM 04/30/2023   10:39 AM  GAD 7 : Generalized Anxiety Score  Nervous, Anxious, on Edge 2 2  Control/stop worrying 1 2  Worry too much - different things 1 2  Trouble relaxing 1 1  Restless 0 2  Easily annoyed or irritable 0 2  Afraid - awful might happen 1   Total GAD 7 Score 6   Anxiety Difficulty Somewhat difficult Somewhat difficult       07/30/2023   11:36 AM 04/30/2023   10:38 AM  Depression screen PHQ 2/9  Decreased Interest 1 1  Down, Depressed, Hopeless 0 2  PHQ - 2 Score 1 3  Altered sleeping 1 3  Tired, decreased energy 1 1  Change in appetite 1 0  Feeling bad or failure about yourself  0 0  Trouble concentrating 2 3  Moving slowly or fidgety/restless 0 0  Suicidal thoughts 0 0  PHQ-9 Score 6 10  Difficult doing work/chores Not difficult at all      Review of Systems  All other systems reviewed and are negative.   Relevant past medical history reviewed and updated as indicated.   Past Medical History:  Diagnosis Date   ADHD    Anxiety    Bipolar 1 disorder (HCC)    Depression      Past Surgical History:  Procedure Laterality Date   ANKLE SURGERY      Allergies and medications reviewed and updated.   Current Outpatient Medications:    acetaminophen (TYLENOL) 500 MG tablet, Take 500 mg by mouth every 6 (six) hours as needed for headache (pain)., Disp: , Rfl:    gabapentin (NEURONTIN) 300 MG capsule, Take 300 mg by mouth 3 (three) times daily., Disp: , Rfl:    LYBALVI 10-10 MG TABS, Take 1 tablet by mouth at  bedtime., Disp: , Rfl:    naproxen sodium (ALEVE) 220 MG tablet, Take 440 mg by mouth 2 (two) times daily as needed (pain/headache)., Disp: , Rfl:   No Known Allergies  Objective:   BP 118/68   Pulse 78   Temp (!) 97.5 F (36.4 C) (Oral)   Ht 6\' 5"  (1.956 m)   Wt 211 lb (95.7 kg)   SpO2 97%   BMI 25.02 kg/m      07/30/2023   11:20 AM 04/30/2023   10:25 AM 12/21/2020    6:31 PM  Vitals with BMI  Height 6\' 5"  6\' 5"    Weight 211 lbs 198 lbs   BMI 25.02 23.47   Systolic 118 120 308  Diastolic 68 70 74  Pulse 78 71 69     Physical Exam Vitals and nursing note reviewed.  Constitutional:      Appearance: Normal appearance. He is normal weight.  HENT:     Head: Normocephalic and atraumatic.  Skin:    General: Skin is warm and dry.     Capillary Refill: Capillary refill takes less than 2 seconds.  Neurological:     General: No focal deficit present.     Mental  Status: He is alert and oriented to person, place, and time. Mental status is at baseline.  Psychiatric:        Mood and Affect: Mood is anxious.        Behavior: Behavior normal.        Thought Content: Thought content normal.        Judgment: Judgment normal.     Assessment & Plan:  Bipolar 1 disorder, depressed (HCC)  Anxiety Assessment & Plan: Well controlled on Lybalvi daily and Gabapentin TID. He is attending outpatient rehab 3 times weekly and seeing psychiatry there. Denies SI/HI. Does have family support, his sister just moved near him. Follow up in my office PRN and keep appts with psychiatry.      Follow up plan: Return in about 9 months (around 04/29/2024) for annual physical with labs 1 week prior.  Park Meo, FNP

## 2023-08-15 DIAGNOSIS — Z419 Encounter for procedure for purposes other than remedying health state, unspecified: Secondary | ICD-10-CM | POA: Diagnosis not present

## 2023-09-15 DIAGNOSIS — Z419 Encounter for procedure for purposes other than remedying health state, unspecified: Secondary | ICD-10-CM | POA: Diagnosis not present

## 2024-05-01 ENCOUNTER — Other Ambulatory Visit: Payer: Medicaid Other

## 2024-05-05 ENCOUNTER — Encounter: Payer: Medicaid Other | Admitting: Family Medicine

## 2024-08-28 ENCOUNTER — Inpatient Hospital Stay (HOSPITAL_COMMUNITY)
Admission: EM | Admit: 2024-08-28 | Discharge: 2024-08-29 | DRG: 202 | Disposition: A | Discharge status: Home / Self Care | Payer: MEDICAID | Attending: Internal Medicine | Admitting: Internal Medicine

## 2024-08-28 ENCOUNTER — Encounter (HOSPITAL_COMMUNITY): Payer: Self-pay | Admitting: *Deleted

## 2024-08-28 ENCOUNTER — Other Ambulatory Visit: Payer: Self-pay

## 2024-08-28 ENCOUNTER — Emergency Department (HOSPITAL_COMMUNITY): Payer: MEDICAID

## 2024-08-28 DIAGNOSIS — R062 Wheezing: Secondary | ICD-10-CM

## 2024-08-28 DIAGNOSIS — F901 Attention-deficit hyperactivity disorder, predominantly hyperactive type: Secondary | ICD-10-CM | POA: Diagnosis present

## 2024-08-28 DIAGNOSIS — F19159 Other psychoactive substance abuse with psychoactive substance-induced psychotic disorder, unspecified: Secondary | ICD-10-CM | POA: Diagnosis present

## 2024-08-28 DIAGNOSIS — J208 Acute bronchitis due to other specified organisms: Secondary | ICD-10-CM | POA: Diagnosis present

## 2024-08-28 DIAGNOSIS — F19959 Other psychoactive substance use, unspecified with psychoactive substance-induced psychotic disorder, unspecified: Secondary | ICD-10-CM | POA: Diagnosis present

## 2024-08-28 DIAGNOSIS — J209 Acute bronchitis, unspecified: Principal | ICD-10-CM | POA: Insufficient documentation

## 2024-08-28 DIAGNOSIS — F419 Anxiety disorder, unspecified: Secondary | ICD-10-CM | POA: Diagnosis present

## 2024-08-28 DIAGNOSIS — R739 Hyperglycemia, unspecified: Secondary | ICD-10-CM

## 2024-08-28 DIAGNOSIS — J45901 Unspecified asthma with (acute) exacerbation: Secondary | ICD-10-CM | POA: Diagnosis present

## 2024-08-28 DIAGNOSIS — F319 Bipolar disorder, unspecified: Secondary | ICD-10-CM | POA: Diagnosis present

## 2024-08-28 DIAGNOSIS — Z79899 Other long term (current) drug therapy: Secondary | ICD-10-CM | POA: Diagnosis not present

## 2024-08-28 LAB — RESPIRATORY PANEL BY PCR

## 2024-08-28 LAB — BASIC METABOLIC PANEL WITH GFR
Anion gap: 13 (ref 5–15)
BUN: 15 mg/dL (ref 6–20)
CO2: 24 mmol/L (ref 22–32)
Calcium: 9.4 mg/dL (ref 8.9–10.3)
Chloride: 102 mmol/L (ref 98–111)
Creatinine, Ser: 1.08 mg/dL (ref 0.61–1.24)
GFR, Estimated: >60 mL/min
Glucose, Bld: 117 mg/dL — ABNORMAL HIGH (ref 70–99)
Potassium: 4 mmol/L (ref 3.5–5.1)
Sodium: 139 mmol/L (ref 135–145)

## 2024-08-28 LAB — CBC
HCT: 44.2 % (ref 39.0–52.0)
Hemoglobin: 15.2 g/dL (ref 13.0–17.0)
MCH: 29.6 pg (ref 26.0–34.0)
MCHC: 34.4 g/dL (ref 30.0–36.0)
MCV: 86.2 fL (ref 80.0–100.0)
Platelets: 306 K/uL (ref 150–400)
RBC: 5.13 MIL/uL (ref 4.22–5.81)
RDW: 12.4 % (ref 11.5–15.5)
WBC: 12 K/uL — ABNORMAL HIGH (ref 4.0–10.5)
nRBC: 0 % (ref 0.0–0.2)

## 2024-08-28 LAB — TROPONIN T, HIGH SENSITIVITY
Troponin T High Sensitivity: <15 ng/L (ref 0–19)
Troponin T High Sensitivity: <15 ng/L (ref 0–19)

## 2024-08-28 LAB — HIV ANTIBODY (ROUTINE TESTING W REFLEX): HIV Screen 4th Generation wRfx: NONREACTIVE

## 2024-08-28 MED ORDER — IPRATROPIUM BROMIDE 0.02 % IN SOLN
0.5000 mg | Freq: Once | RESPIRATORY_TRACT | Status: AC
  Administered 2024-08-28: 0.5 mg via RESPIRATORY_TRACT
  Filled 2024-08-28: qty 2.5

## 2024-08-28 MED ORDER — ONDANSETRON HCL 4 MG/2ML IJ SOLN: 4.0000 mg | Freq: Four times a day (QID) | INTRAMUSCULAR | Status: DC | PRN

## 2024-08-28 MED ORDER — ACETAMINOPHEN 325 MG PO TABS
650.0000 mg | ORAL_TABLET | ORAL | Status: DC | PRN
  Administered 2024-08-28: 650 mg via ORAL
  Filled 2024-08-28: qty 2

## 2024-08-28 MED ORDER — ALBUTEROL SULFATE HFA 108 (90 BASE) MCG/ACT IN AERS
2.0000 | INHALATION_SPRAY | Freq: Once | RESPIRATORY_TRACT | Status: AC
  Administered 2024-08-28: 2 via RESPIRATORY_TRACT
  Filled 2024-08-28: qty 6.7

## 2024-08-28 MED ORDER — NALTREXONE HCL 50 MG PO TABS
50.0000 mg | ORAL_TABLET | Freq: Every day | ORAL | Status: DC
  Administered 2024-08-28 – 2024-08-29 (×2): 50 mg via ORAL
  Filled 2024-08-28 (×2): qty 1

## 2024-08-28 MED ORDER — IPRATROPIUM-ALBUTEROL 0.5-2.5 (3) MG/3ML IN SOLN
3.0000 mL | Freq: Once | RESPIRATORY_TRACT | Status: AC
  Administered 2024-08-28: 3 mL via RESPIRATORY_TRACT
  Filled 2024-08-28: qty 3

## 2024-08-28 MED ORDER — AZITHROMYCIN 500 MG PO TABS
500.0000 mg | ORAL_TABLET | Freq: Every day | ORAL | Status: DC
  Administered 2024-08-28 – 2024-08-29 (×2): 500 mg via ORAL
  Filled 2024-08-28: qty 2
  Filled 2024-08-28: qty 1

## 2024-08-28 MED ORDER — GUAIFENESIN-DM 100-10 MG/5ML PO SYRP
10.0000 mL | ORAL_SOLUTION | Freq: Four times a day (QID) | ORAL | Status: DC | PRN
  Administered 2024-08-28: 10 mL via ORAL
  Filled 2024-08-28: qty 10

## 2024-08-28 MED ORDER — ENOXAPARIN SODIUM 40 MG/0.4ML IJ SOSY
40.0000 mg | PREFILLED_SYRINGE | INTRAMUSCULAR | Status: DC
  Administered 2024-08-28: 40 mg via SUBCUTANEOUS
  Filled 2024-08-28: qty 0.4

## 2024-08-28 MED ORDER — ALBUTEROL SULFATE (2.5 MG/3ML) 0.083% IN NEBU
15.0000 mg/h | INHALATION_SOLUTION | Freq: Once | RESPIRATORY_TRACT | Status: AC
  Administered 2024-08-28: 15 mg/h via RESPIRATORY_TRACT
  Filled 2024-08-28: qty 18

## 2024-08-28 MED ORDER — GABAPENTIN 300 MG PO CAPS
300.0000 mg | ORAL_CAPSULE | Freq: Three times a day (TID) | ORAL | Status: DC
  Administered 2024-08-28 – 2024-08-29 (×4): 300 mg via ORAL
  Filled 2024-08-28 (×4): qty 1

## 2024-08-28 MED ORDER — METHYLPREDNISOLONE SODIUM SUCC 40 MG IJ SOLR
40.0000 mg | Freq: Two times a day (BID) | INTRAMUSCULAR | Status: DC
  Administered 2024-08-28 – 2024-08-29 (×3): 40 mg via INTRAVENOUS
  Filled 2024-08-28 (×3): qty 1

## 2024-08-28 MED ORDER — TRAZODONE HCL 50 MG PO TABS
150.0000 mg | ORAL_TABLET | Freq: Every day | ORAL | Status: DC
  Administered 2024-08-28: 150 mg via ORAL
  Filled 2024-08-28: qty 3

## 2024-08-28 MED ORDER — OLANZAPINE-SAMIDORPHAN 15-10 MG PO TABS
1.0000 | ORAL_TABLET | Freq: Every day | ORAL | Status: DC
  Administered 2024-08-28: 1 via ORAL
  Filled 2024-08-28 (×3): qty 1

## 2024-08-28 MED ORDER — IPRATROPIUM-ALBUTEROL 0.5-2.5 (3) MG/3ML IN SOLN: 3.0000 mL | RESPIRATORY_TRACT | Status: DC | PRN

## 2024-08-28 MED ORDER — PREDNISONE 20 MG PO TABS
60.0000 mg | ORAL_TABLET | Freq: Once | ORAL | Status: AC
  Administered 2024-08-28: 60 mg via ORAL
  Filled 2024-08-28: qty 3

## 2024-08-28 NOTE — ED Notes (Signed)
Floor notified that patient is coming up

## 2024-08-28 NOTE — ED Notes (Signed)
 Expiratory noise as he is breathing

## 2024-08-28 NOTE — ED Notes (Signed)
 To x-ray

## 2024-08-28 NOTE — ED Notes (Addendum)
 Awaiting naltrexone  from main pharmacy

## 2024-08-28 NOTE — ED Notes (Signed)
 RN notified by pharmacy that patients home olanzapine  prescription is unavailable in hospital. Patient notified by RN and plan to have family go home to obtain med.

## 2024-08-28 NOTE — ED Provider Notes (Signed)
 " [place] EMERGENCY DEPARTMENT AT [facility] Provider Note   CSN: [id number] Arrival date & time: 08/28/24  0034     Patient presents with: Shortness of Breath   Travis Glass is a 34 y.o. male.   The history is provided by the patient.  Shortness of Breath  He has history of bipolar disorder, attention deficit disorder comes in complaining of difficulty breathing for the last 3 days with some audible wheezing.  He has had a cough productive of some yellowish to greenish sputum.  He denies fever, chills, sweats.  He does endorse some bodyaches.  He is a non-smoker but vapes.  He denies any sick contacts.  In triage, he had been given an albuterol  inhaler which she did say gave slight relief but seems to be wearing off.    Prior to Admission medications  Medication Sig Start Date End Date Taking? Authorizing Provider  acetaminophen  (TYLENOL ) 500 MG tablet Take 500 mg by mouth every 6 (six) hours as needed for headache (pain).    [provider]  gabapentin  (NEURONTIN ) 300 MG capsule Take 300 mg by mouth 3 (three) times daily.    [provider]  LYBALVI  10-10 MG TABS Take 1 tablet by mouth at bedtime. 07/06/23   [provider]  naproxen sodium (ALEVE) 220 MG tablet Take 440 mg by mouth 2 (two) times daily as needed (pain/headache).    [provider]    Allergies: Patient has no known allergies.    Review of Systems  Respiratory:  Positive for shortness of breath.   All other systems reviewed and are negative.   Updated Vital Signs BP (!) 180/111   Pulse 98   Temp 98.1 F (36.7 C)   Resp (!) 22   Ht 6' 6 (1.981 m)   Wt 95.7 kg   SpO2 93%   BMI 24.38 kg/m   Physical Exam Vitals and nursing note reviewed.   34 year old male, resting comfortably and in no acute distress. Vital signs are significant for elevated blood pressure and slightly elevated respiratory rate. Oxygen saturation is 98%, which is normal. Head  is normocephalic and atraumatic. PERRLA, EOMI.  Lungs have diffuse expiratory wheezes without rales or rhonchi. Chest is nontender. Heart has regular rate and rhythm without murmur. Abdomen is soft, flat, nontender. Skin is warm and dry without rash. Neurologic: Awake and alert, moves all extremities equally.  (all labs ordered are listed, but only abnormal results are displayed) Labs Reviewed  BASIC METABOLIC PANEL WITH GFR - Abnormal; Notable for the following components:      Result Value   Glucose, Bld 117 (*)    All other components within normal limits  CBC - Abnormal; Notable for the following components:   WBC 12.0 (*)    All other components within normal limits  TROPONIN T, HIGH SENSITIVITY  TROPONIN T, HIGH SENSITIVITY    EKG Interpretation Date/Time:  Thursday August 28 2024 00:52:39 EST Ventricular Rate:  109 PR Interval:  130 QRS Duration:  88 QT Interval:  338 QTC Calculation: 455 R Axis:   110  Text Interpretation: Sinus tachycardia Left posterior fascicular block Left ventricular hypertrophy with secondary repolarization abnormality Abnormal ECG No previous ECGs available Confirmed by Raford Lenis ([id number]) on 08/28/2024 3:07:42 AM        Radiology: DG Chest 2 View Result Date: 08/28/2024 EXAM: 2 VIEW(S) XRAY OF THE CHEST 08/28/2024 01:01:00 AM COMPARISON: None available. CLINICAL HISTORY: respiratory difficulty for 3  days he has a inspiratory wheeze FINDINGS: LUNGS AND PLEURA: No focal pulmonary opacity. No pleural effusion. No pneumothorax. HEART AND MEDIASTINUM: No acute abnormality of the cardiac and mediastinal silhouettes. BONES AND SOFT TISSUES: No acute osseous abnormality. IMPRESSION: 1. No acute process. Electronically signed by: Dorethia Molt MD 08/28/2024 01:08 AM EST RP Workstation: [id number]     Procedures   Medications Ordered in the ED  albuterol  (VENTOLIN  HFA) 108 (90 Base) MCG/ACT inhaler 2 puff (2 puffs Inhalation Given 08/28/24 0059)   ipratropium-albuterol  (DUONEB) 0.5-2.5 (3) MG/3ML nebulizer solution 3 mL (3 mLs Nebulization Given 08/28/24 0334)  predniSONE  (DELTASONE ) tablet 60 mg (60 mg Oral Given 08/28/24 0333)  ipratropium-albuterol  (DUONEB) 0.5-2.5 (3) MG/3ML nebulizer solution 3 mL (3 mLs Nebulization Given 08/28/24 0359)  albuterol  (PROVENTIL ) (2.5 MG/3ML) 0.083% nebulizer solution (15 mg/hr Nebulization Given 08/28/24 0517)  ipratropium (ATROVENT ) nebulizer solution 0.5 mg (0.5 mg Nebulization Given 08/28/24 0517)                                    Medical Decision Making Risk Prescription drug management. Decision regarding hospitalization.   Respiratory tract infection with cough and wheezing.  Differential diagnosis includes, but is not limited to, pneumonia, acute bronchitis, influenza, RSV, COVID-19, other viral infections.  Chest x-ray shows no evidence of pneumonia.  I have independently viewed the images, and agree with the radiologist's interpretation.  I have reviewed his laboratory tests, my interpretation is mild leukocytosis which is nonspecific, normal troponin, elevated random glucose level which will need to be followed as an outpatient.  I have reviewed his electrocardiogram, my interpretation is left ventricular hypertrophy with secondary repolarization changes, no prior ECG available for comparison.  Patient is outside the treatment window for influenza, no indication for testing for influenza or COVID-19 as he has no underlying health conditions which would put him at high risk for severe COVID infection.  I have ordered a dose of prednisone  and nebulizer treatment albuterol  and ipratropium.  Following nebulizer treatment, there was little change.  He still had considerable wheezing.  I ordered a second nebulizer treatment which also gave little change.  I have ordered a third as an hour-long continuous nebulizer treatment.  Following this, his wheezing actually seem to have increased slightly.  He is  not safe for discharge and has failed intensive emergency department treatment.  I have discussed case with Dr. Cherlyn of [facility], who agrees to admit the patient.  CRITICAL CARE Performed by: Alm Lias Total critical care time: 35 minutes Critical care time was exclusive of separately billable procedures and treating other patients. Critical care was necessary to treat or prevent imminent or life-threatening deterioration. Critical care was time spent personally by me on the following activities: development of treatment plan with patient and/or surrogate as well as nursing, discussions with consultants, evaluation of patient's response to treatment, examination of patient, obtaining history from patient or surrogate, ordering and performing treatments and interventions, ordering and review of laboratory studies, ordering and review of radiographic studies, pulse oximetry and re-evaluation of patient's condition.     Final diagnoses:  Acute bronchitis, unspecified organism  Wheezing  Elevated random blood glucose level    ED Discharge Orders     None          Lias Alm, MD 08/28/24 [phone]  "

## 2024-08-28 NOTE — ED Notes (Signed)
 The pt reports a little relief after he used the inhaler

## 2024-08-28 NOTE — ED Triage Notes (Addendum)
 Noisy breathing for 3 days no history of asthma or other breathing problems  he does vape

## 2024-08-28 NOTE — H&P (Signed)
 " History and Physical    Patient: Travis Glass FMW:[id number] DOB: 02/17/1991 DOA: 08/28/2024 DOS: the patient was seen and examined on 08/28/2024 PCP: Kayla Jeoffrey RAMAN, FNP  Patient coming from: Home  Chief Complaint:  Chief Complaint  Patient presents with   Shortness of Breath   HPI: Travis Glass is a 34 y.o. male with medical history significant of ADHD, psychoactive substance induced psychosis, anxiety, depression, presents to ED with complaints of sob, productive cough for 3 days. He denies fevers, chills, nausea, vomiting, diarrhea or abdominal pain. He reports vaping daily but does not smoke. He reports having headaches, but no dizziness, chest pain, syncope or any urinary complaints.    ED work up Patient was found to be Afebrile, tachypnea of 22/min, BP of 143/90 mmhg.  Labs are remarkable for wbc count of 12,000.   CXR shows no acute process.  EKG sinus tachycardia.  Troponin levels are negative.  He was found to be wheezing and received 3 rounds of albuterol  nebs with some improvement, but continued to be wheezing and coughing.  He was referred to  Review of Systems: As mentioned in the history of present illness. All other systems reviewed and are negative. Past Medical History:  Diagnosis Date   ADHD    Anxiety    Bipolar 1 disorder (HCC)    Depression    Past Surgical History:  Procedure Laterality Date   ANKLE SURGERY     Social History:  reports that he has never smoked. He has never used smokeless tobacco. He reports current drug use. Drugs: Cocaine and Heroin. He reports that he does not drink alcohol.  Allergies[1]  History reviewed. No pertinent family history.  Prior to Admission medications  Medication Sig Start Date End Date Taking? Authorizing Provider  gabapentin  (NEURONTIN ) 300 MG capsule Take 300 mg by mouth 3 (three) times daily.   Yes [provider]  LYBALVI  15-10 MG TABS Take 1 tablet by mouth at bedtime. 08/05/24   Yes [provider]  naltrexone  (DEPADE) 50 MG tablet Take 50 mg by mouth daily.   Yes [provider]  traZODone  (DESYREL ) 150 MG tablet Take 150 mg by mouth at bedtime. 08/05/24  Yes [provider]    Physical Exam: Vitals:   08/28/24 0043 08/28/24 0410 08/28/24 0517 08/28/24 0828  BP:  (!) 143/90    Pulse:  86    Resp:  20    Temp:  97.7 F (36.5 C)  97.9 F (36.6 C)  TempSrc:  Oral  Oral  SpO2:  99% 98%   Weight: 95.7 kg     Height: 6' 6 (1.981 m)      General exam: Appears calm and comfortable  Respiratory system: rhonchi and scattered wheezing posterior, air entry fair. Tachypnic  Cardiovascular system: S1 & S2 heard, RRR. No JVD, Gastrointestinal system: Abdomen is nondistended, soft and nontender.  Central nervous system: Alert and oriented. No focal neurological deficits. Extremities: Symmetric 5 x 5 power. Skin: No rashes,  Psychiatry: anxious.   Data Reviewed: Results for orders placed or performed during the hospital encounter of 08/28/24 (from the past 24 hours)  Basic metabolic panel     Status: Abnormal   Collection Time: 08/28/24 12:50 AM  Result Value Ref Range   Sodium 139 135 - 145 mmol/L   Potassium 4.0 3.5 - 5.1 mmol/L   Chloride 102 98 - 111 mmol/L   CO2 24 22 - 32 mmol/L   Glucose, Bld 117 (  H) 70 - 99 mg/dL   BUN 15 6 - 20 mg/dL   Creatinine, Ser 8.91 0.61 - 1.24 mg/dL   Calcium 9.4 8.9 - 89.6 mg/dL   GFR, Estimated >39 >39 mL/min   Anion gap 13 5 - 15  CBC     Status: Abnormal   Collection Time: 08/28/24 12:50 AM  Result Value Ref Range   WBC 12.0 (H) 4.0 - 10.5 K/uL   RBC 5.13 4.22 - 5.81 MIL/uL   Hemoglobin 15.2 13.0 - 17.0 g/dL   HCT 55.7 60.9 - 47.9 %   MCV 86.2 80.0 - 100.0 fL   MCH 29.6 26.0 - 34.0 pg   MCHC 34.4 30.0 - 36.0 g/dL   RDW 87.5 88.4 - 84.4 %   Platelets 306 150 - 400 K/uL   nRBC 0.0 0.0 - 0.2 %  Troponin T, High Sensitivity     Status: None   Collection Time: 08/28/24 12:50 AM  Result  Value Ref Range   Troponin T High Sensitivity <15 0 - 19 ng/L  Troponin T, High Sensitivity     Status: None   Collection Time: 08/28/24  2:40 AM  Result Value Ref Range   Troponin T High Sensitivity <15 0 - 19 ng/L     Assessment and Plan:  Acute bronchitis / Probably viral infection  ? Asthma exacerbation.  Respiratory panel ordered.  Started him on IV solumedrol 40 mg BID, azithromycin  and duonebs.  Robitussin added.  Sputum cultures ordered.     ADHD - resume home meds.    H/o anxiety and depression:   Patient is on Olanzapine  samidorphan 1 tab DAILY, continue the same.    Substance abuse disorder Patient on Naltrexone , continue the same.    Advance Care Planning:   Code Status: Full Code   Consults: none.   Family Communication: none at bedside.   Severity of Illness: The appropriate patient status for this patient is OBSERVATION. Observation status is judged to be reasonable and necessary in order to provide the required intensity of service to ensure the patient's safety. The patient's presenting symptoms, physical exam findings, and initial radiographic and laboratory data in the context of their medical condition is felt to place them at decreased risk for further clinical deterioration. Furthermore, it is anticipated that the patient will be medically stable for discharge from the hospital within 2 midnights of admission.   Author: Elgie Butter, MD 08/28/2024 8:47 AM  For on call review [email or web address].     [1] No Known Allergies  "

## 2024-08-29 ENCOUNTER — Other Ambulatory Visit (HOSPITAL_COMMUNITY): Payer: Self-pay

## 2024-08-29 MED ORDER — ACETAMINOPHEN 325 MG PO TABS: 650.0000 mg | ORAL_TABLET | ORAL | Status: AC | PRN

## 2024-08-29 MED ORDER — AZITHROMYCIN 500 MG PO TABS
500.0000 mg | ORAL_TABLET | Freq: Every day | ORAL | 0 refills | Status: AC
  Filled 2024-08-29: qty 4, 4d supply, fill #0

## 2024-08-29 MED ORDER — PREDNISONE 20 MG PO TABS
40.0000 mg | ORAL_TABLET | Freq: Every day | ORAL | 0 refills | Status: AC
  Filled 2024-08-29: qty 8, 4d supply, fill #0

## 2024-08-29 MED ORDER — COMBIVENT RESPIMAT 20-100 MCG/ACT IN AERS
1.0000 | INHALATION_SPRAY | Freq: Four times a day (QID) | RESPIRATORY_TRACT | 0 refills | Status: AC
  Filled 2024-08-29: qty 4, 30d supply, fill #0

## 2024-08-29 NOTE — Discharge Summary (Signed)
 Physician Discharge Summary  Patient ID: Travis Glass MRN: [id number] DOB/AGE: Dec 31, 1990 34 y.o.  Admit date: 08/28/2024 Discharge date: 08/29/2024  Admission Diagnoses:  Discharge Diagnoses:  Principal Problem:   Asthma exacerbation Active Problems:   Attention deficit hyperactivity disorder (ADHD), predominantly hyperactive type   Psychoactive substance-induced psychosis (HCC)   Anxiety   Acute bronchitis   Discharged Condition: stable  Hospital Course: Patient is a 34 year old male with past medical history significant for ADHD, psychoactive substance induced psychosis, anxiety and depression.  Patient was admitted with acute bronchitis/viral infection.  Patient was managed supportively with Solu-Medrol , azithromycin  and DuoNebs.  Respiratory panel PCR came back negative.  Chest x-ray was nonrevealing.  Symptoms are resolved.  Patient is eager to be discharged back home.  Patient will follow-up with primary care provider within 1 week of discharge.  ADHD: Anxiety: Depression: Substance abuse disorder:  Consults: None  Significant Diagnostic Studies:  Chest x-ray: - No acute process.  Respiratory panel by PCR: - Negative.   Discharge Exam: Blood pressure 128/75, pulse 79, temperature 97.6 F (36.4 C), resp. rate 18, height 6' 6 (1.981 m), weight 95.7 kg, SpO2 92%.   Disposition: Discharge disposition: 01-Home or Self Care       Discharge Instructions     Increase activity slowly   Complete by: As directed       Allergies as of 08/29/2024   No Known Allergies      Medication List     TAKE these medications    acetaminophen  325 MG tablet Commonly known as: TYLENOL  Take 2 tablets (650 mg total) by mouth every 4 (four) hours as needed for fever, headache or mild pain (pain score 1-3).   azithromycin  500 MG tablet Commonly known as: ZITHROMAX  Take 1 tablet (500 mg total) by mouth daily for 4 days. Start taking on: August 30, 2024    Combivent  Respimat 20-100 MCG/ACT Aers respimat Generic drug: Ipratropium-Albuterol  Inhale 1 puff into the lungs every 6 (six) hours.   gabapentin  300 MG capsule Commonly known as: NEURONTIN  Take 300 mg by mouth 3 (three) times daily.   Lybalvi  15-10 MG Tabs Generic drug: OLANZapine -Samidorphan Take 1 tablet by mouth at bedtime.   naltrexone  50 MG tablet Commonly known as: DEPADE Take 50 mg by mouth daily.   predniSONE  20 MG tablet Commonly known as: DELTASONE  Take 2 tablets (40 mg total) by mouth daily with breakfast for 4 days.   traZODone  150 MG tablet Commonly known as: DESYREL  Take 150 mg by mouth at bedtime.       Time spent: 35 minutes.  SignedBETHA Leatrice LILLETTE Rosario 08/29/2024, 11:53 AM

## 2024-08-29 NOTE — Discharge Instructions (Signed)
 SABRA

## 2024-09-01 ENCOUNTER — Telehealth: Payer: Self-pay | Admitting: *Deleted

## 2024-09-01 NOTE — Transitions of Care (Post Inpatient/ED Visit) (Signed)
" ° °  09/01/2024  Name: DANNIS DEROCHE MRN: [id number] DOB: 1991/04/03  Today's TOC FU Call Status: Today's TOC FU Call Status:: Unsuccessful Call (1st Attempt) Unsuccessful Call (1st Attempt) Date: 09/01/24  Attempted to reach the patient regarding the most recent Inpatient visit.  Left HIPAA compliant voice message   Follow Up Plan: Additional outreach attempts will be made to reach the patient to complete the Transitions of Care (Post Inpatient/ED visit) call.   Pls call/ message for questions,  Carmeline Kowal Mckinney Jabin Tapp, RN, BSN, CCRN Alumnus RN Care Manager  Transitions of Care  [facility] - [facility] [phone]: direct office  "

## 2024-09-02 ENCOUNTER — Telehealth: Payer: Self-pay | Admitting: *Deleted

## 2024-09-02 NOTE — Transitions of Care (Post Inpatient/ED Visit) (Signed)
" ° °  09/02/2024  Name: NEKO BOYAJIAN MRN: [id number] DOB: Dec 17, 1990  Today's TOC FU Call Status: Today's TOC FU Call Status:: Unsuccessful Call (2nd Attempt) Unsuccessful Call (2nd Attempt) Date: 09/02/24  Attempted to reach the patient regarding the most recent Inpatient visit.  Left HIPAA compliant voice message   Follow Up Plan: Additional outreach attempts will be made to reach the patient to complete the Transitions of Care (Post Inpatient/ED visit) call.   Pls call/ message for questions,  Emelin Dascenzo Mckinney Nonie Lochner, RN, BSN, CCRN Alumnus RN Care Manager  Transitions of Care  [facility] - [facility] [phone]: direct office  "

## 2024-09-04 ENCOUNTER — Telehealth: Payer: Self-pay | Admitting: *Deleted

## 2024-09-04 NOTE — Transitions of Care (Post Inpatient/ED Visit) (Signed)
" ° °  09/04/2024  Name: BLANCA THORNTON MRN: [id number] DOB: April 23, 1991  Today's TOC FU Call Status: Today's TOC FU Call Status:: Unsuccessful Call (3rd Attempt) Unsuccessful Call (3rd Attempt) Date: 09/04/24  Attempted to reach the patient regarding the most recent Inpatient visit.  Left HIPAA compliant voice message  Follow Up Plan: No further outreach attempts will be made at this time. We have been unable to contact the patient.  Pls call/ message for questions,  Macky Galik Mckinney Teddy Pena, RN, BSN, CCRN Alumnus RN Care Manager  Transitions of Care  [facility] - [facility] [phone]: direct office  "
